# Patient Record
Sex: Male | Born: 1945 | Race: White | Hispanic: No | Marital: Married | Smoking: Former smoker
Health system: Southern US, Community
[De-identification: ages and names within clinical notes are randomized; demographics above are authoritative.]

## PROBLEM LIST (undated history)

## (undated) DIAGNOSIS — Z87442 Personal history of urinary calculi: Secondary | ICD-10-CM

## (undated) DIAGNOSIS — E785 Hyperlipidemia, unspecified: Secondary | ICD-10-CM

## (undated) DIAGNOSIS — I10 Essential (primary) hypertension: Secondary | ICD-10-CM

## (undated) DIAGNOSIS — C801 Malignant (primary) neoplasm, unspecified: Secondary | ICD-10-CM

## (undated) DIAGNOSIS — E119 Type 2 diabetes mellitus without complications: Secondary | ICD-10-CM

## (undated) DIAGNOSIS — K635 Polyp of colon: Secondary | ICD-10-CM

## (undated) DIAGNOSIS — Z8619 Personal history of other infectious and parasitic diseases: Secondary | ICD-10-CM

## (undated) HISTORY — PX: OTHER SURGICAL HISTORY: SHX169

---

## 2008-08-15 DIAGNOSIS — C801 Malignant (primary) neoplasm, unspecified: Secondary | ICD-10-CM

## 2008-08-15 HISTORY — DX: Malignant (primary) neoplasm, unspecified: C80.1

## 2008-11-15 ENCOUNTER — Ambulatory Visit: Payer: Self-pay | Admitting: Gastroenterology

## 2008-12-15 HISTORY — PX: COLON SURGERY: SHX602

## 2008-12-24 ENCOUNTER — Ambulatory Visit: Payer: Self-pay | Admitting: Surgery

## 2008-12-30 ENCOUNTER — Inpatient Hospital Stay: Payer: Self-pay | Admitting: Surgery

## 2012-03-14 ENCOUNTER — Ambulatory Visit: Payer: Self-pay | Admitting: Gastroenterology

## 2012-03-14 HISTORY — PX: COLONOSCOPY: SHX174

## 2014-07-02 ENCOUNTER — Ambulatory Visit: Payer: Self-pay | Admitting: Surgery

## 2014-07-31 ENCOUNTER — Ambulatory Visit: Payer: Self-pay | Admitting: Surgery

## 2014-07-31 HISTORY — PX: OTHER SURGICAL HISTORY: SHX169

## 2014-09-03 NOTE — Consult Note (Signed)
    General Aspect 69 yo male with history of hypertension and hyperlipidemia who was noted to have pvcs with bigeminey and couplets post colonscopy. He is asymptmatic. He has been told he had an irregular heart beat in the past when in the hospital in the past. He was placed on magnesium for this. He is currently resting comfortably with no chest pain or dizziness. He denies syncope or presyncope.   Physical Exam:   GEN well developed, well nourished, no acute distress    HEENT PERRL, hearing intact to voice    NECK supple    RESP no use of accessory muscles    CARD Regular rate and rhythm  with pvcs    ABD denies tenderness  normal BS    LYMPH negative neck, negative axillae    EXTR negative cyanosis/clubbing, negative edema    SKIN normal to palpation    NEURO cranial nerves intact, motor/sensory function intact    PSYCH A+O to time, place, person   Review of Systems:   Subjective/Chief Complaint no complaints    General: No Complaints    Skin: No Complaints    ENT: No Complaints    Eyes: No Complaints    Neck: No Complaints    Respiratory: No Complaints    Cardiovascular: Palpitations    Gastrointestinal: No Complaints    Genitourinary: No Complaints    Vascular: No Complaints    Musculoskeletal: No Complaints    Neurologic: No Complaints    Hematologic: No Complaints    Endocrine: No Complaints    Psychiatric: No Complaints    Review of Systems: All other systems were reviewed and found to be negative    Medications/Allergies Reviewed Medications/Allergies reviewed     Hypertension:    Skin Cancer:   Home Medications: Medication Instructions Status  tylenol as needed for pain   Active  hydrochlorothiazide-lisinopril 12.5 mg-20 mg oral tablet 1   once a day AM Active  lovastatin 40 mg oral tablet 1   once a day AM Active   EKG:   EKG NSR    Interpretation nsr with pvcs    Vicodin: N/V/Diarrhea  Codeine: N/V/Diarrhea  Amoxicillin:  Unknown    Impression Pt with no prior cardaic history noted to have pvcs with bigeminey and occasional couplet post colonscopy. He is currently stable. No history of sustained arryhtmia in the past. was told he had irregular heart beat in the past and was placed on magnesium. No symtpoms at present. Baseline ekg is sr with pvcs.    Plan 1. Avoid caffiene and other stimulants. 2. Continue current meds including magnesium 3. 24 hour holter 4. Follow up in [facility] cardiology on Tuesday November 5th at 8:30 am   Electronic Signatures: Teodoro Spray (MD)  (Signed 29-Oct-13 16:52)  Authored: General Aspect/Present Illness, History and Physical Exam, Review of System, Past Medical History, Home Medications, EKG , Allergies, Impression/Plan   Last Updated: 29-Oct-13 16:52 by Teodoro Spray (MD)

## 2017-06-06 ENCOUNTER — Encounter: Payer: Self-pay | Admitting: *Deleted

## 2017-06-07 ENCOUNTER — Ambulatory Visit
Admission: RE | Admit: 2017-06-07 | Discharge: 2017-06-07 | Disposition: A | Discharge status: Home / Self Care | Payer: Medicare Other | Source: Ambulatory Visit | Attending: Gastroenterology | Admitting: Gastroenterology

## 2017-06-07 ENCOUNTER — Ambulatory Visit: Payer: Medicare Other | Admitting: Anesthesiology

## 2017-06-07 ENCOUNTER — Encounter
Admission: RE | Disposition: A | Discharge status: Home / Self Care | Payer: Self-pay | Source: Ambulatory Visit | Attending: Gastroenterology

## 2017-06-07 ENCOUNTER — Encounter: Payer: Self-pay | Admitting: *Deleted

## 2017-06-07 DIAGNOSIS — Z880 Allergy status to penicillin: Secondary | ICD-10-CM | POA: Insufficient documentation

## 2017-06-07 DIAGNOSIS — Z79899 Other long term (current) drug therapy: Secondary | ICD-10-CM | POA: Insufficient documentation

## 2017-06-07 DIAGNOSIS — Z87891 Personal history of nicotine dependence: Secondary | ICD-10-CM | POA: Diagnosis not present

## 2017-06-07 DIAGNOSIS — Z980 Intestinal bypass and anastomosis status: Secondary | ICD-10-CM | POA: Diagnosis not present

## 2017-06-07 DIAGNOSIS — K640 First degree hemorrhoids: Secondary | ICD-10-CM | POA: Insufficient documentation

## 2017-06-07 DIAGNOSIS — Z1211 Encounter for screening for malignant neoplasm of colon: Secondary | ICD-10-CM | POA: Insufficient documentation

## 2017-06-07 DIAGNOSIS — Z7982 Long term (current) use of aspirin: Secondary | ICD-10-CM | POA: Diagnosis not present

## 2017-06-07 DIAGNOSIS — Z86010 Personal history of colonic polyps: Secondary | ICD-10-CM | POA: Diagnosis not present

## 2017-06-07 DIAGNOSIS — E119 Type 2 diabetes mellitus without complications: Secondary | ICD-10-CM | POA: Diagnosis not present

## 2017-06-07 DIAGNOSIS — E785 Hyperlipidemia, unspecified: Secondary | ICD-10-CM | POA: Insufficient documentation

## 2017-06-07 DIAGNOSIS — I10 Essential (primary) hypertension: Secondary | ICD-10-CM | POA: Insufficient documentation

## 2017-06-07 HISTORY — DX: Hyperlipidemia, unspecified: E78.5

## 2017-06-07 HISTORY — DX: Malignant (primary) neoplasm, unspecified: C80.1

## 2017-06-07 HISTORY — DX: Essential (primary) hypertension: I10

## 2017-06-07 HISTORY — DX: Personal history of urinary calculi: Z87.442

## 2017-06-07 HISTORY — PX: COLONOSCOPY WITH PROPOFOL: SHX5780

## 2017-06-07 HISTORY — DX: Type 2 diabetes mellitus without complications: E11.9

## 2017-06-07 HISTORY — DX: Personal history of other infectious and parasitic diseases: Z86.19

## 2017-06-07 HISTORY — DX: Polyp of colon: K63.5

## 2017-06-07 SURGERY — COLONOSCOPY WITH PROPOFOL
Anesthesia: General

## 2017-06-07 MED ORDER — PROPOFOL 10 MG/ML IV BOLUS
INTRAVENOUS | Status: AC
  Filled 2017-06-07: qty 20

## 2017-06-07 MED ORDER — PHENYLEPHRINE HCL 10 MG/ML IJ SOLN
INTRAMUSCULAR | Status: DC | PRN
  Administered 2017-06-07: 200 ug via INTRAVENOUS
  Administered 2017-06-07: 100 ug via INTRAVENOUS

## 2017-06-07 MED ORDER — SODIUM CHLORIDE 0.9 % IV SOLN: INTRAVENOUS | Status: DC

## 2017-06-07 MED ORDER — LIDOCAINE HCL (CARDIAC) 20 MG/ML IV SOLN
INTRAVENOUS | Status: DC | PRN
  Administered 2017-06-07: 80 mg via INTRAVENOUS

## 2017-06-07 MED ORDER — LACTATED RINGERS IV SOLN
INTRAVENOUS | Status: DC | PRN
  Administered 2017-06-07: 14:00:00 via INTRAVENOUS

## 2017-06-07 MED ORDER — PROPOFOL 500 MG/50ML IV EMUL
INTRAVENOUS | Status: DC | PRN
  Administered 2017-06-07: 100 ug/kg/min via INTRAVENOUS

## 2017-06-07 NOTE — Anesthesia Preprocedure Evaluation (Signed)
Anesthesia Evaluation  Patient identified by MRN, date of birth, ID band Patient awake    Reviewed: Allergy & Precautions, NPO status , Patient's Chart, lab work & pertinent test results  Airway Mallampati: III  TM Distance: <3 FB     Dental  (+) Chipped, Caps   Pulmonary former smoker,    Pulmonary exam normal        Cardiovascular hypertension, Normal cardiovascular exam     Neuro/Psych negative neurological ROS  negative psych ROS   GI/Hepatic Neg liver ROS, Hx of colon polyp   Endo/Other  diabetes  Renal/GU negative Renal ROS  negative genitourinary   Musculoskeletal negative musculoskeletal ROS (+)   Abdominal Normal abdominal exam  (+)   Peds negative pediatric ROS (+)  Hematology negative hematology ROS (+)   Anesthesia Other Findings   Reproductive/Obstetrics                             Anesthesia Physical Anesthesia Plan  ASA: II  Anesthesia Plan: General   Post-op Pain Management:    Induction: Intravenous  PONV Risk Score and Plan:   Airway Management Planned: Nasal Cannula  Additional Equipment:   Intra-op Plan:   Post-operative Plan:   Informed Consent: I have reviewed the patients History and Physical, chart, labs and discussed the procedure including the risks, benefits and alternatives for the proposed anesthesia with the patient or authorized representative who has indicated his/her understanding and acceptance.     Plan Discussed with: CRNA and Surgeon  Anesthesia Plan Comments:         Anesthesia Quick Evaluation

## 2017-06-07 NOTE — Op Note (Signed)
[facility] Gastroenterology Patient Name: Jacob Kelly Procedure Date: 06/07/2017 2:22 PM MRN: [id number] Account #: [id number] Date of Birth: June 28, 1945 Admit Type: Outpatient Age: 72 Room: [facility] ENDO ROOM 3 Gender: Male Note Status: Finalized Procedure:            Colonoscopy Indications:          Personal history of colonic polyps Providers:            Lollie Sails, MD Referring MD:         Christena Flake. Raechel Ache, MD (Referring MD) Medicines:            Monitored Anesthesia Care Complications:        No immediate complications. Procedure:            Pre-Anesthesia Assessment:                       - ASA Grade Assessment: II - A patient with mild                        systemic disease.                       After obtaining informed consent, the colonoscope was                        passed under direct vision. Throughout the procedure,                        the patient's blood pressure, pulse, and oxygen                        saturations were monitored continuously. The Olympus                        PCF-H180AL colonoscope ( S#: [id number] ) was introduced                        through the anus and advanced to the the ileocolonic                        anastomosis. The colonoscopy was performed without                        difficulty. The patient tolerated the procedure well.                        The quality of the bowel preparation was good. Findings:      There was evidence of a prior functional end-to-end ileo-colonic       anastomosis in the distal ascending colon. This was patent and was       characterized by erosion, biopsied. The anastomosis was traversed.      The exam was otherwise without abnormality.      Non-bleeding hemorrhoids were found during retroflexion and during       anoscopy. The hemorrhoids were small and Grade I (internal hemorrhoids       that do not prolapse). Impression:           - Patent functional end-to-end ileo-colonic  anastomosis, characterized by erosion.                       - The examination was otherwise normal.                       - Non-bleeding hemorrhoids. Recommendation:       - Await pathology results.                       - Telephone GI clinic for pathology results in 1 week. Procedure Code(s):    --- Professional ---                       [id number], Colonoscopy, flexible; diagnostic, including                        collection of specimen(s) by brushing or washing, when                        performed (separate procedure) Diagnosis Code(s):    --- Professional ---                       Z98.0, Intestinal bypass and anastomosis status                       K64.0, First degree hemorrhoids                       Z86.010, Personal history of colonic polyps CPT copyright 2016 [employer]. All rights reserved. The codes documented in this report are preliminary and upon coder review may  be revised to meet current compliance requirements. Lollie Sails, MD 06/07/2017 3:15:13 PM This report has been signed electronically. Number of Addenda: 0 Note Initiated On: 06/07/2017 2:22 PM Scope Withdrawal Time: 0 hours 10 minutes 24 seconds  Total Procedure Duration: 0 hours 28 minutes 50 seconds       [facility]

## 2017-06-07 NOTE — Anesthesia Post-op Follow-up Note (Signed)
Anesthesia QCDR form completed.        

## 2017-06-07 NOTE — H&P (Signed)
Outpatient short stay form Pre-procedure 06/07/2017 1:30 PM Lollie Sails MD  Primary Physician: Dr. Genene Churn  Reason for visit:  Colonoscopy  History of present illness:  Personal history of adenomatous colon polyps.    Current Facility-Administered Medications:  .  0.9 %  sodium chloride infusion, , Intravenous, Continuous, Lollie Sails, MD  Medications Prior to Admission  Medication Sig Dispense Refill Last Dose  . aspirin EC 81 MG tablet Take 81 mg by mouth daily.   Past Week at Unknown time  . ibuprofen (ADVIL,MOTRIN) 200 MG tablet Take 200 mg by mouth every 8 (eight) hours as needed.   Past Week at Unknown time  . ketoconazole (NIZORAL) 2 % shampoo Apply 1 application topically 3 (three) times a week.   06/07/2017 at Unknown time  . lisinopril-hydrochlorothiazide (PRINZIDE,ZESTORETIC) 20-12.5 MG tablet Take 1 tablet by mouth daily.   06/07/2017 at Unknown time  . lovastatin (MEVACOR) 40 MG tablet Take 40 mg by mouth at bedtime.   06/06/2017 at Unknown time  . Magnesium 250 MG TABS Take 250 mg by mouth daily.   Past Week at Unknown time     Allergies  Allergen Reactions  . Amoxicillin   . Hydrocodone   . Tramadol      Past Medical History:  Diagnosis Date  . 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 Colon polyp   . Diabetes mellitus without complication ([place])   . History of chickenpox   . History of kidney stones   . Hyperlipidemia   . Hypertension     Review of systems:      Physical Exam    Heart and lungs:     HEENT:     Other:     Pertinant exam for procedure:     Planned proceedures:     Lollie Sails, MD Gastroenterology 06/07/2017  1:30 PM

## 2017-06-07 NOTE — H&P (Signed)
Outpatient short stay form Pre-procedure 06/07/2017 2:21 PM Jacob Sails MD  Primary Physician: Dr. Genene Churn  Reason for visit:  Colonoscopy  History of present illness:  Jacob Kelly is a 72 year old male with a known history of adenomatous colon polyps. His last colonoscopy was 03/14/2012. He has had a right hemicolectomy due to a rather large tubular adenoma/villous adenoma. There are no polyps on the last colonoscopy. He tolerated his prep well. He does take 81 mg aspirin daily but no other aspirin products or blood thinning agents. He did have some problems with his prep last night with some nausea and faintness. This has resolved.    Current Facility-Administered Medications:  .  0.9 %  sodium chloride infusion, , Intravenous, Continuous, Jacob Sails, MD .  0.9 %  sodium chloride infusion, , Intravenous, Continuous, Jacob Sails, MD .  0.9 %  sodium chloride infusion, , Intravenous, Continuous, Jacob Sails, MD  Medications Prior to Admission  Medication Sig Dispense Refill Last Dose  . aspirin EC 81 MG tablet Take 81 mg by mouth daily.   Past Week at Unknown time  . ibuprofen (ADVIL,MOTRIN) 200 MG tablet Take 200 mg by mouth every 8 (eight) hours as needed.   Past Week at Unknown time  . ketoconazole (NIZORAL) 2 % shampoo Apply 1 application topically 3 (three) times a week.   06/07/2017 at Unknown time  . lisinopril-hydrochlorothiazide (PRINZIDE,ZESTORETIC) 20-12.5 MG tablet Take 1 tablet by mouth daily.   06/07/2017 at Unknown time  . lovastatin (MEVACOR) 40 MG tablet Take 40 mg by mouth at bedtime.   06/06/2017 at Unknown time  . Magnesium 250 MG TABS Take 250 mg by mouth daily.   Past Week at Unknown time     Allergies  Allergen Reactions  . Amoxicillin   . Hydrocodone   . Tramadol      Past Medical History:  Diagnosis Date  . 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 Colon polyp   . Diabetes mellitus without complication (Jacob Kelly)   . History of  chickenpox   . History of kidney stones   . Hyperlipidemia   . Hypertension     Review of systems:      Physical Exam    Heart and lungs: Regular rate and rhythm without rub or gallop, lungs are bilaterally clear.    HEENT: Normocephalic atraumatic eyes are anicteric    Other:     Pertinant exam for procedure: Soft nontender nondistended bowel sounds positive normoactive.    Planned proceedures: Colonoscopy and indicated procedures. I have discussed the risks benefits and complications of procedures to include not limited to bleeding, infection, perforation and the risk of sedation and the Jacob Kelly wishes to proceed.    Jacob Sails, MD Gastroenterology 06/07/2017  2:21 PM

## 2017-06-07 NOTE — Transfer of Care (Signed)
Immediate Anesthesia Transfer of Care Note  Patient: Jacob Kelly  Procedure(s) Performed: COLONOSCOPY WITH PROPOFOL (N/A )  Patient Location: PACU  Anesthesia Type:General  Level of Consciousness: sedated  Airway & Oxygen Therapy: Patient Spontanous Breathing and Patient connected to nasal cannula oxygen  Post-op Assessment: Report given to RN and Post -op Vital signs reviewed and stable  Post vital signs: Reviewed and stable  Last Vitals:  Vitals:   06/07/17 1516 06/07/17 1517  BP: (!) 84/56 (!) 84/56  Pulse: 77 77  Resp: 11 17  Temp: 36.4 C (!) 36.1 C  SpO2: 97% 97%    Last Pain:  Vitals:   06/07/17 1517  TempSrc: Tympanic  PainSc:       Patients Stated Pain Goal: 0 (24/09/73 [id number])  Complications: No apparent anesthesia complications

## 2017-06-08 ENCOUNTER — Encounter: Payer: Self-pay | Admitting: Gastroenterology

## 2017-06-08 NOTE — Anesthesia Postprocedure Evaluation (Signed)
Anesthesia Post Note  Patient: Jacob Kelly  Procedure(s) Performed: COLONOSCOPY WITH PROPOFOL (N/A )  Patient location during evaluation: PACU Anesthesia Type: General Level of consciousness: awake and alert and oriented Pain management: pain level controlled Vital Signs Assessment: post-procedure vital signs reviewed and stable Respiratory status: spontaneous breathing Cardiovascular status: blood pressure returned to baseline Anesthetic complications: no     Last Vitals:  Vitals:   06/07/17 1525 06/07/17 1535  BP: (!) 86/58 117/67  Pulse: 73 79  Resp: 13 14  Temp:    SpO2: 98% 98%    Last Pain:  Vitals:   06/07/17 1535  TempSrc:   PainSc: 0-No pain                 Drena Ham

## 2017-06-10 LAB — SURGICAL PATHOLOGY

## 2017-06-27 ENCOUNTER — Other Ambulatory Visit: Payer: Self-pay | Admitting: Internal Medicine

## 2017-06-27 DIAGNOSIS — Z136 Encounter for screening for cardiovascular disorders: Secondary | ICD-10-CM

## 2017-07-04 ENCOUNTER — Ambulatory Visit
Admission: RE | Admit: 2017-07-04 | Discharge: 2017-07-04 | Disposition: A | Discharge status: Home / Self Care | Payer: Medicare Other | Source: Ambulatory Visit | Attending: Internal Medicine | Admitting: Internal Medicine

## 2017-07-04 DIAGNOSIS — I10 Essential (primary) hypertension: Secondary | ICD-10-CM | POA: Insufficient documentation

## 2017-07-04 DIAGNOSIS — I700 Atherosclerosis of aorta: Secondary | ICD-10-CM | POA: Diagnosis not present

## 2017-07-04 DIAGNOSIS — Z87891 Personal history of nicotine dependence: Secondary | ICD-10-CM | POA: Diagnosis not present

## 2017-07-04 DIAGNOSIS — Z136 Encounter for screening for cardiovascular disorders: Secondary | ICD-10-CM | POA: Insufficient documentation

## 2019-06-26 IMAGING — US US ABDOMINAL AORTA SCREENING AAA
1 series · 14 of 25 positions shown · non-contrast
Comparison: None.

CLINICAL DATA: Male between 65-75 years of age with a smoking
history.

EXAM:
US ABDOMINAL AORTA MEDICARE SCREENING
TECHNIQUE: Ultrasound examination of the abdominal aorta was performed as a
screening evaluation for abdominal aortic aneurysm.

[Series 1: us abdominal aorta screening aaa · 0.28mm/px · 14 of 27 slices shown]
[im 1/27]
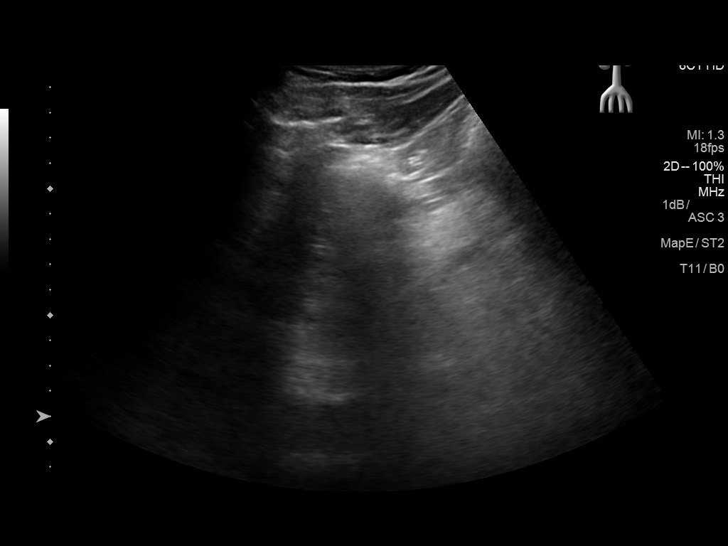
[im 3/27]
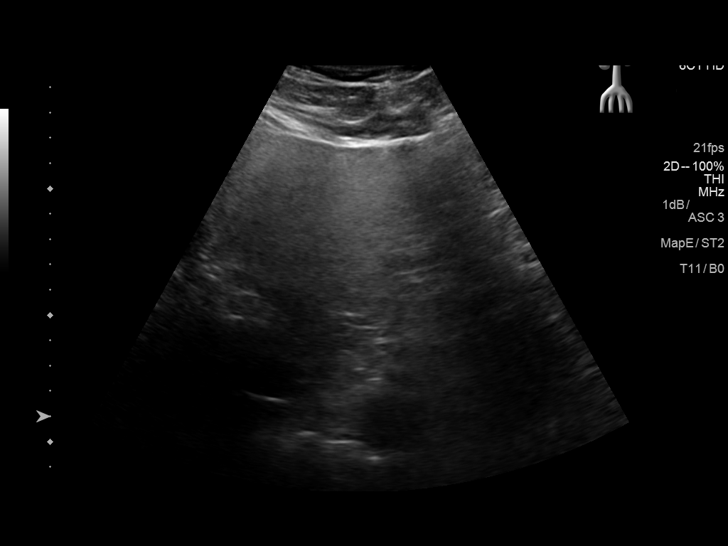
[im 5/27]
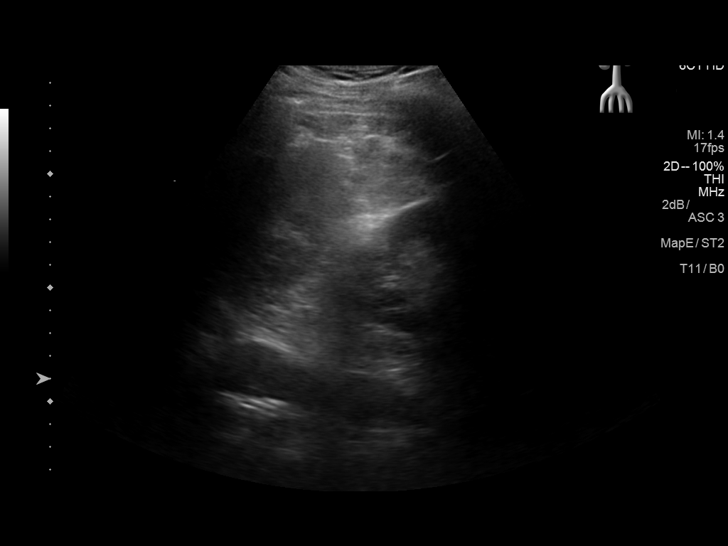
[im 7/27]
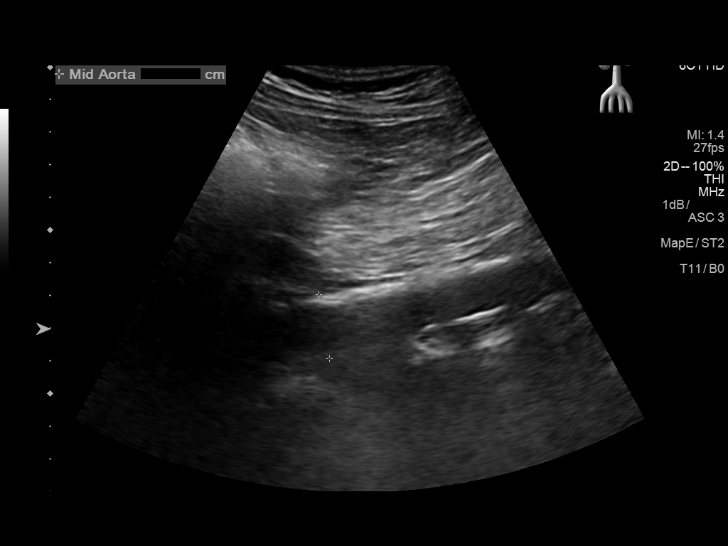
[im 9/27]
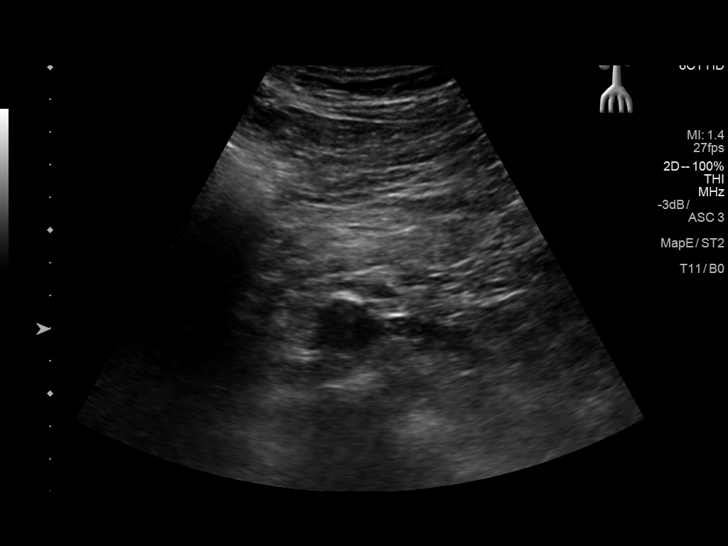
[im 10/27]
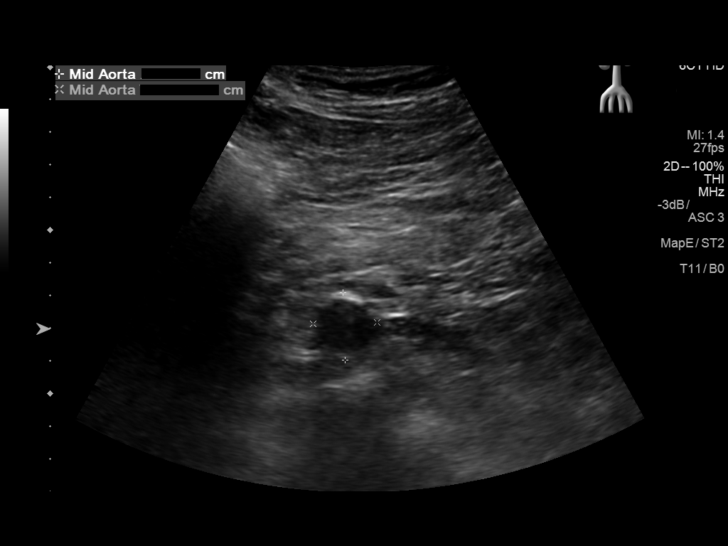
[im 12/27]
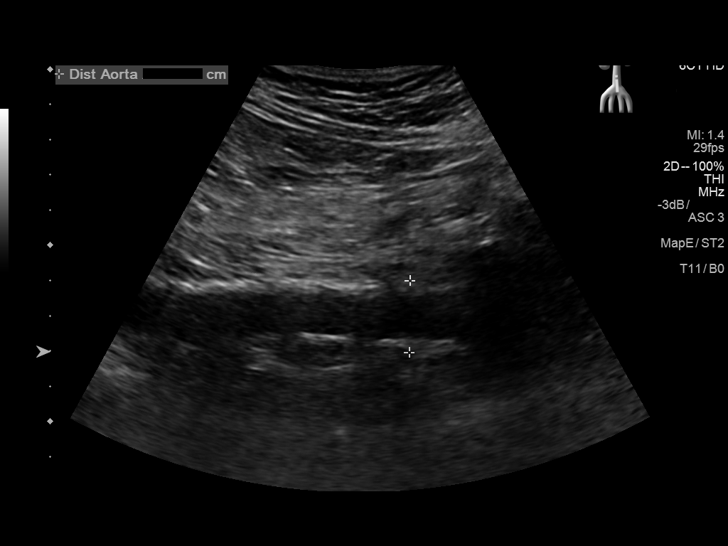
[im 15/27]
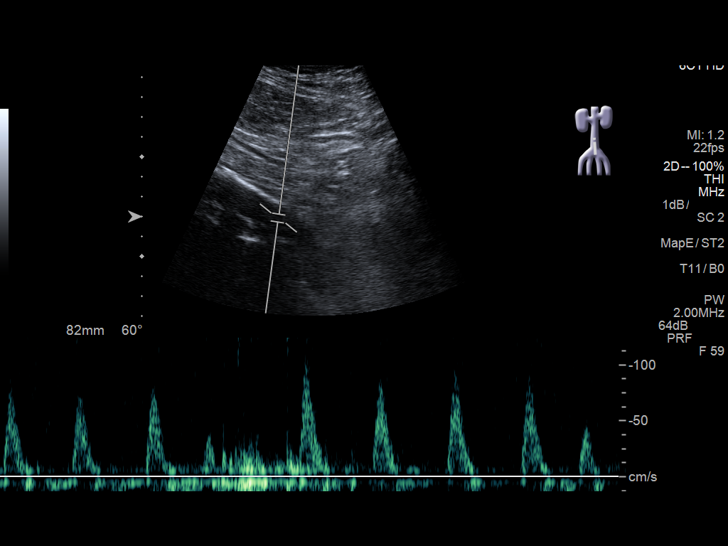
[im 17/27]
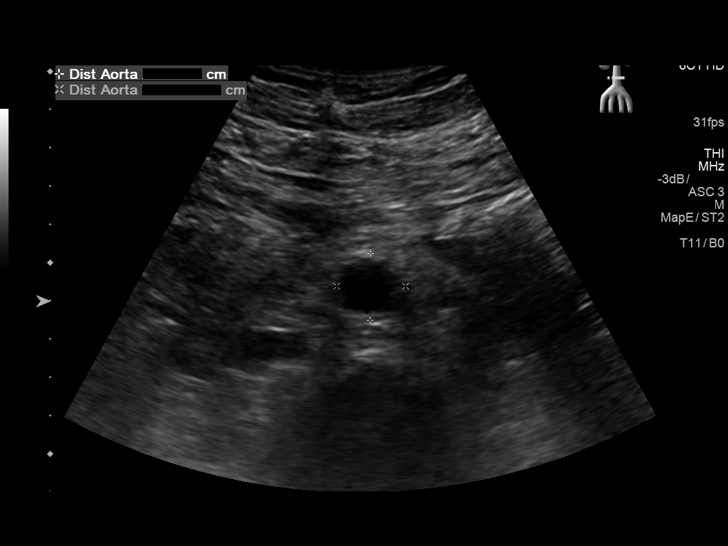
[im 18/27]
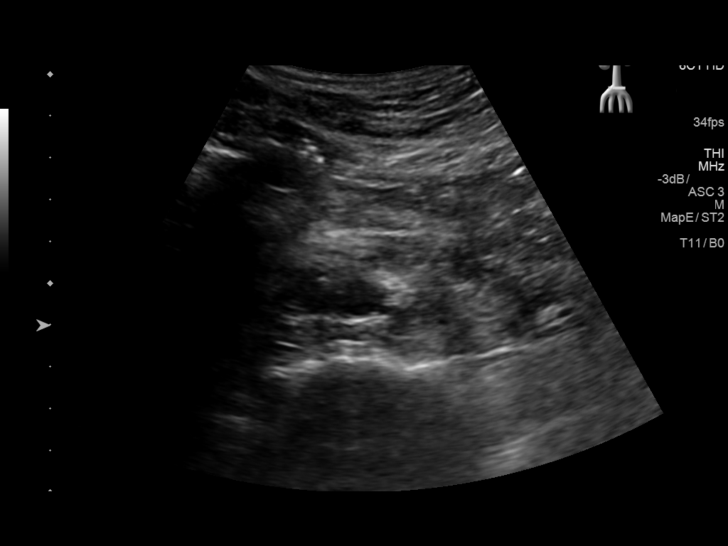
[im 20/27]
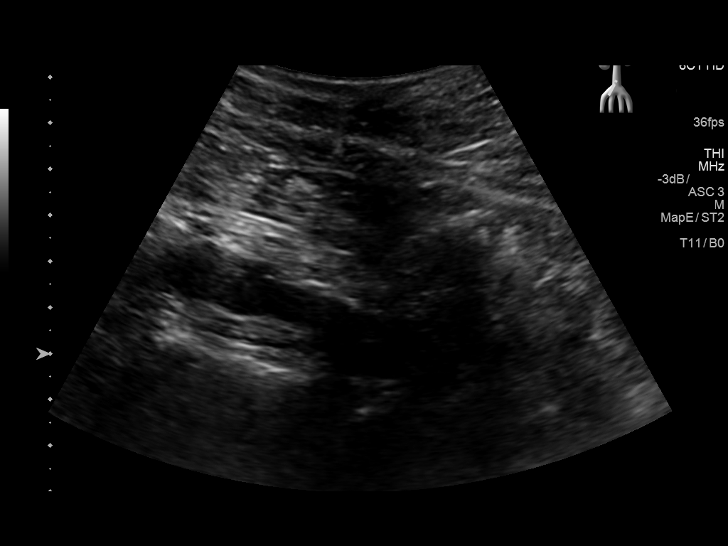
[im 22/27]
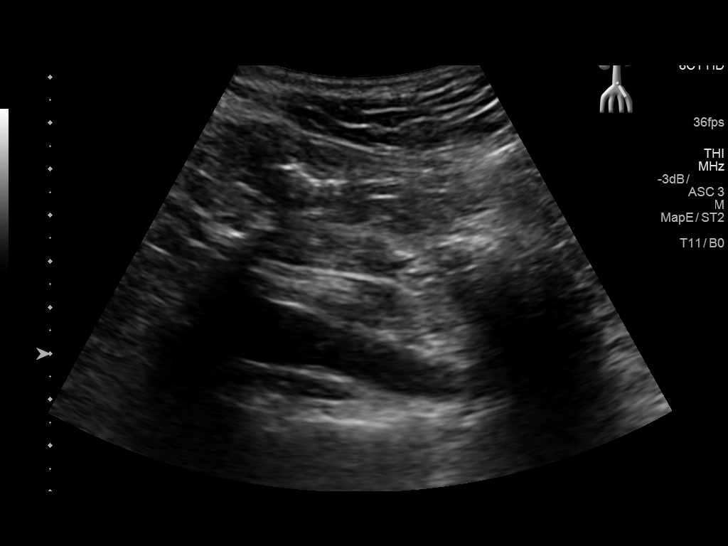
[im 24/27]
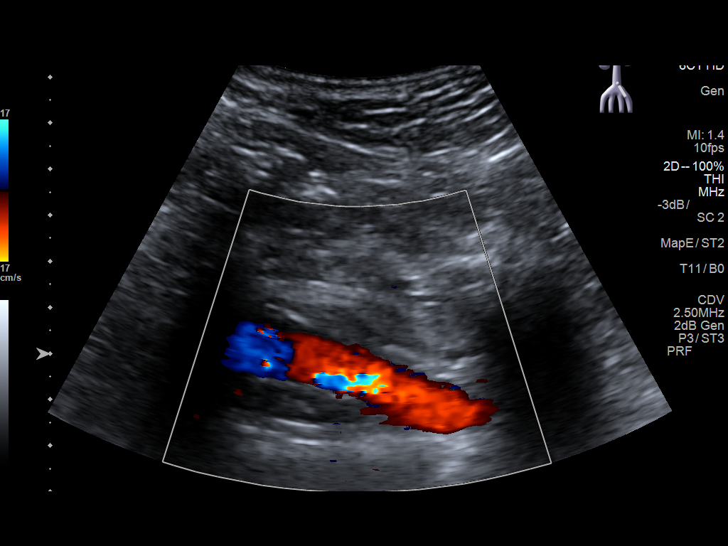
[im 27/27]
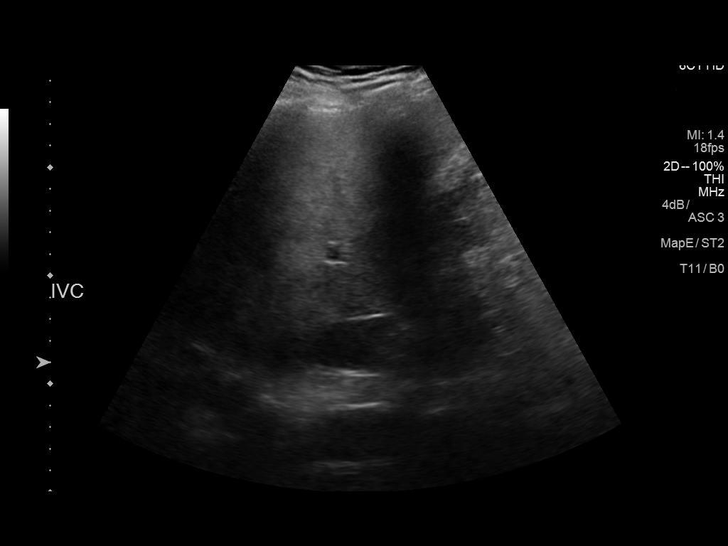

[14 of 25 positions shown; findings below may reference images not displayed]

FINDINGS: Abdominal aortic measurements as follows:

Proximal:  2.6 cm

Mid:  2.1 cm

Distal:  2.0 cm

Atherosclerotic plaque noted.
IMPRESSION: Aortic atherosclerosis.  Negative for abdominal aortic aneurysm.

## 2022-08-03 DIAGNOSIS — Z1329 Encounter for screening for other suspected endocrine disorder: Secondary | ICD-10-CM | POA: Diagnosis not present

## 2022-08-03 DIAGNOSIS — I700 Atherosclerosis of aorta: Secondary | ICD-10-CM | POA: Diagnosis not present

## 2022-08-03 DIAGNOSIS — E782 Mixed hyperlipidemia: Secondary | ICD-10-CM | POA: Diagnosis not present

## 2022-08-03 DIAGNOSIS — Z1331 Encounter for screening for depression: Secondary | ICD-10-CM | POA: Diagnosis not present

## 2022-08-03 DIAGNOSIS — E119 Type 2 diabetes mellitus without complications: Secondary | ICD-10-CM | POA: Diagnosis not present

## 2022-08-03 DIAGNOSIS — Z79899 Other long term (current) drug therapy: Secondary | ICD-10-CM | POA: Diagnosis not present

## 2022-08-03 DIAGNOSIS — I10 Essential (primary) hypertension: Secondary | ICD-10-CM | POA: Diagnosis not present

## 2022-08-03 DIAGNOSIS — Z0000 Encounter for general adult medical examination without abnormal findings: Secondary | ICD-10-CM | POA: Diagnosis not present

## 2022-08-03 DIAGNOSIS — E538 Deficiency of other specified B group vitamins: Secondary | ICD-10-CM | POA: Diagnosis not present

## 2022-08-03 DIAGNOSIS — R7303 Prediabetes: Secondary | ICD-10-CM | POA: Diagnosis not present

## 2022-08-03 DIAGNOSIS — Z86010 Personal history of colonic polyps: Secondary | ICD-10-CM | POA: Diagnosis not present

## 2022-08-03 DIAGNOSIS — I499 Cardiac arrhythmia, unspecified: Secondary | ICD-10-CM | POA: Diagnosis not present

## 2022-08-03 DIAGNOSIS — M7501 Adhesive capsulitis of right shoulder: Secondary | ICD-10-CM | POA: Diagnosis not present

## 2022-08-16 DIAGNOSIS — Z0100 Encounter for examination of eyes and vision without abnormal findings: Secondary | ICD-10-CM | POA: Diagnosis not present

## 2022-08-16 DIAGNOSIS — H04221 Epiphora due to insufficient drainage, right lacrimal gland: Secondary | ICD-10-CM | POA: Diagnosis not present

## 2022-08-16 DIAGNOSIS — H2513 Age-related nuclear cataract, bilateral: Secondary | ICD-10-CM | POA: Diagnosis not present

## 2023-02-08 DIAGNOSIS — R7303 Prediabetes: Secondary | ICD-10-CM | POA: Diagnosis not present

## 2023-02-08 DIAGNOSIS — I10 Essential (primary) hypertension: Secondary | ICD-10-CM | POA: Diagnosis not present

## 2023-02-08 DIAGNOSIS — I499 Cardiac arrhythmia, unspecified: Secondary | ICD-10-CM | POA: Diagnosis not present

## 2023-02-08 DIAGNOSIS — Z09 Encounter for follow-up examination after completed treatment for conditions other than malignant neoplasm: Secondary | ICD-10-CM | POA: Diagnosis not present

## 2023-02-08 DIAGNOSIS — R7302 Impaired glucose tolerance (oral): Secondary | ICD-10-CM | POA: Diagnosis not present

## 2023-02-08 DIAGNOSIS — Z23 Encounter for immunization: Secondary | ICD-10-CM | POA: Diagnosis not present

## 2023-02-08 DIAGNOSIS — E782 Mixed hyperlipidemia: Secondary | ICD-10-CM | POA: Diagnosis not present

## 2023-02-08 DIAGNOSIS — E538 Deficiency of other specified B group vitamins: Secondary | ICD-10-CM | POA: Diagnosis not present

## 2023-04-19 DIAGNOSIS — L570 Actinic keratosis: Secondary | ICD-10-CM | POA: Diagnosis not present

## 2023-04-19 DIAGNOSIS — Z85828 Personal history of other malignant neoplasm of skin: Secondary | ICD-10-CM | POA: Diagnosis not present

## 2023-04-19 DIAGNOSIS — Z872 Personal history of diseases of the skin and subcutaneous tissue: Secondary | ICD-10-CM | POA: Diagnosis not present

## 2023-04-19 DIAGNOSIS — L218 Other seborrheic dermatitis: Secondary | ICD-10-CM | POA: Diagnosis not present

## 2023-04-19 DIAGNOSIS — L2089 Other atopic dermatitis: Secondary | ICD-10-CM | POA: Diagnosis not present

## 2023-04-19 DIAGNOSIS — L578 Other skin changes due to chronic exposure to nonionizing radiation: Secondary | ICD-10-CM | POA: Diagnosis not present

## 2023-08-17 DIAGNOSIS — E119 Type 2 diabetes mellitus without complications: Secondary | ICD-10-CM | POA: Diagnosis not present

## 2023-08-17 DIAGNOSIS — H2513 Age-related nuclear cataract, bilateral: Secondary | ICD-10-CM | POA: Diagnosis not present

## 2023-09-07 DIAGNOSIS — Z860101 Personal history of adenomatous and serrated colon polyps: Secondary | ICD-10-CM | POA: Diagnosis not present

## 2023-09-07 DIAGNOSIS — Z1329 Encounter for screening for other suspected endocrine disorder: Secondary | ICD-10-CM | POA: Diagnosis not present

## 2023-09-07 DIAGNOSIS — E119 Type 2 diabetes mellitus without complications: Secondary | ICD-10-CM | POA: Diagnosis not present

## 2023-09-07 DIAGNOSIS — E538 Deficiency of other specified B group vitamins: Secondary | ICD-10-CM | POA: Diagnosis not present

## 2023-09-07 DIAGNOSIS — I119 Hypertensive heart disease without heart failure: Secondary | ICD-10-CM | POA: Diagnosis not present

## 2023-09-07 DIAGNOSIS — M7501 Adhesive capsulitis of right shoulder: Secondary | ICD-10-CM | POA: Diagnosis not present

## 2023-09-07 DIAGNOSIS — I499 Cardiac arrhythmia, unspecified: Secondary | ICD-10-CM | POA: Diagnosis not present

## 2023-09-07 DIAGNOSIS — I10 Essential (primary) hypertension: Secondary | ICD-10-CM | POA: Diagnosis not present

## 2023-09-07 DIAGNOSIS — Z79899 Other long term (current) drug therapy: Secondary | ICD-10-CM | POA: Diagnosis not present

## 2023-09-07 DIAGNOSIS — Z125 Encounter for screening for malignant neoplasm of prostate: Secondary | ICD-10-CM | POA: Diagnosis not present

## 2023-09-07 DIAGNOSIS — Z1331 Encounter for screening for depression: Secondary | ICD-10-CM | POA: Diagnosis not present

## 2023-09-07 DIAGNOSIS — Z0000 Encounter for general adult medical examination without abnormal findings: Secondary | ICD-10-CM | POA: Diagnosis not present

## 2023-09-07 DIAGNOSIS — E782 Mixed hyperlipidemia: Secondary | ICD-10-CM | POA: Diagnosis not present

## 2023-09-07 DIAGNOSIS — M7540 Impingement syndrome of unspecified shoulder: Secondary | ICD-10-CM | POA: Diagnosis not present

## 2023-09-07 DIAGNOSIS — I700 Atherosclerosis of aorta: Secondary | ICD-10-CM | POA: Diagnosis not present

## 2024-03-08 DIAGNOSIS — I10 Essential (primary) hypertension: Secondary | ICD-10-CM | POA: Diagnosis not present

## 2024-03-08 DIAGNOSIS — E119 Type 2 diabetes mellitus without complications: Secondary | ICD-10-CM | POA: Diagnosis not present

## 2024-03-08 DIAGNOSIS — Z125 Encounter for screening for malignant neoplasm of prostate: Secondary | ICD-10-CM | POA: Diagnosis not present

## 2024-04-18 DIAGNOSIS — L578 Other skin changes due to chronic exposure to nonionizing radiation: Secondary | ICD-10-CM | POA: Diagnosis not present

## 2024-04-18 DIAGNOSIS — L218 Other seborrheic dermatitis: Secondary | ICD-10-CM | POA: Diagnosis not present

## 2024-04-18 DIAGNOSIS — Z85828 Personal history of other malignant neoplasm of skin: Secondary | ICD-10-CM | POA: Diagnosis not present

## 2024-04-18 DIAGNOSIS — L2089 Other atopic dermatitis: Secondary | ICD-10-CM | POA: Diagnosis not present

## 2024-04-18 DIAGNOSIS — L570 Actinic keratosis: Secondary | ICD-10-CM | POA: Diagnosis not present

## 2024-04-18 DIAGNOSIS — Z872 Personal history of diseases of the skin and subcutaneous tissue: Secondary | ICD-10-CM | POA: Diagnosis not present
